# Patient Record
Sex: Male | Born: 1985 | Race: White | Hispanic: No | Marital: Single | Smoking: Current every day smoker
Health system: Southern US, Community
[De-identification: ages and names within clinical notes are randomized; demographics above are authoritative.]

## PROBLEM LIST (undated history)

## (undated) DIAGNOSIS — K589 Irritable bowel syndrome without diarrhea: Secondary | ICD-10-CM

---
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
## 2009-02-02 ENCOUNTER — Emergency Department (HOSPITAL_COMMUNITY): Admission: EM | Admit: 2009-02-02 | Discharge: 2009-02-02 | Payer: Self-pay | Admitting: Emergency Medicine

## 2010-02-27 ENCOUNTER — Emergency Department (HOSPITAL_COMMUNITY): Admission: EM | Admit: 2010-02-27 | Discharge: 2010-02-28 | Payer: Self-pay | Admitting: Emergency Medicine

## 2010-05-03 ENCOUNTER — Emergency Department (HOSPITAL_COMMUNITY)
Admission: EM | Admit: 2010-05-03 | Discharge: 2010-05-03 | Payer: Self-pay | Source: Home / Self Care | Admitting: Emergency Medicine

## 2010-06-15 LAB — DIFFERENTIAL
Basophils Absolute: 0.1 10*3/uL (ref 0.0–0.1)
Basophils Relative: 1 % (ref 0–1)
Eosinophils Absolute: 0.6 K/uL (ref 0.0–0.7)
Eosinophils Relative: 5 % (ref 0–5)
Lymphocytes Relative: 14 % (ref 12–46)
Lymphs Abs: 1.8 10*3/uL (ref 0.7–4.0)
Monocytes Absolute: 1.5 K/uL — ABNORMAL HIGH (ref 0.1–1.0)
Monocytes Relative: 12 % (ref 3–12)
Neutro Abs: 9 K/uL — ABNORMAL HIGH (ref 1.7–7.7)
Neutrophils Relative %: 70 % (ref 43–77)

## 2010-06-15 LAB — BASIC METABOLIC PANEL WITH GFR
BUN: 6 mg/dL (ref 6–23)
CO2: 25 meq/L (ref 19–32)
Chloride: 106 meq/L (ref 96–112)
Glucose, Bld: 105 mg/dL — ABNORMAL HIGH (ref 70–99)
Potassium: 3.3 meq/L — ABNORMAL LOW (ref 3.5–5.1)
Sodium: 139 meq/L (ref 135–145)

## 2010-06-15 LAB — CBC
HCT: 45.6 % (ref 39.0–52.0)
Hemoglobin: 15.9 g/dL (ref 13.0–17.0)
MCH: 32 pg (ref 26.0–34.0)
MCHC: 34.9 g/dL (ref 30.0–36.0)
MCV: 91.8 fL (ref 78.0–100.0)
Platelets: 213 10*3/uL (ref 150–400)
RBC: 4.97 MIL/uL (ref 4.22–5.81)
RDW: 14.3 % (ref 11.5–15.5)
WBC: 12.9 10*3/uL — ABNORMAL HIGH (ref 4.0–10.5)

## 2010-06-15 LAB — URINE MICROSCOPIC-ADD ON

## 2010-06-15 LAB — URINALYSIS, ROUTINE W REFLEX MICROSCOPIC
Bilirubin Urine: NEGATIVE
Glucose, UA: NEGATIVE mg/dL
Hgb urine dipstick: NEGATIVE
Ketones, ur: 15 mg/dL — AB
Leukocytes, UA: NEGATIVE
Nitrite: NEGATIVE
Protein, ur: 30 mg/dL — AB
Specific Gravity, Urine: 1.024 (ref 1.005–1.030)
Urobilinogen, UA: 0.2 mg/dL (ref 0.0–1.0)
pH: 5.5 (ref 5.0–8.0)

## 2010-06-15 LAB — BASIC METABOLIC PANEL
Calcium: 8.6 mg/dL (ref 8.4–10.5)
Creatinine, Ser: 0.7 mg/dL (ref 0.4–1.5)
GFR calc Af Amer: >60 mL/min (ref >60)
GFR calc non Af Amer: >60 mL/min (ref >60)

## 2011-06-03 ENCOUNTER — Encounter (HOSPITAL_COMMUNITY): Payer: Self-pay | Admitting: *Deleted

## 2011-06-03 ENCOUNTER — Emergency Department (HOSPITAL_COMMUNITY)
Admission: EM | Admit: 2011-06-03 | Discharge: 2011-06-03 | Disposition: A | Discharge status: Home / Self Care | Payer: Self-pay | Attending: Emergency Medicine | Admitting: Emergency Medicine

## 2011-06-03 DIAGNOSIS — K0889 Other specified disorders of teeth and supporting structures: Secondary | ICD-10-CM

## 2011-06-03 DIAGNOSIS — K089 Disorder of teeth and supporting structures, unspecified: Secondary | ICD-10-CM | POA: Insufficient documentation

## 2011-06-03 DIAGNOSIS — F17200 Nicotine dependence, unspecified, uncomplicated: Secondary | ICD-10-CM | POA: Insufficient documentation

## 2011-06-03 HISTORY — DX: Irritable bowel syndrome, unspecified: K58.9

## 2011-06-03 MED ORDER — HYDROCODONE-ACETAMINOPHEN 5-325 MG PO TABS
ORAL_TABLET | ORAL | Status: AC
  Administered 2011-06-03: 1 via ORAL
  Filled 2011-06-03: qty 1

## 2011-06-03 MED ORDER — PENICILLIN V POTASSIUM 500 MG PO TABS: 500.0000 mg | ORAL_TABLET | Freq: Four times a day (QID) | ORAL | Status: AC

## 2011-06-03 MED ORDER — HYDROCODONE-ACETAMINOPHEN 7.5-500 MG/15ML PO SOLN: 10.0000 mL | Freq: Once | ORAL | Status: DC

## 2011-06-03 MED ORDER — HYDROCODONE-ACETAMINOPHEN 5-325 MG PO TABS: 1.0000 | ORAL_TABLET | Freq: Four times a day (QID) | ORAL | Status: AC | PRN

## 2011-06-03 MED ORDER — HYDROCODONE-ACETAMINOPHEN 5-325 MG PO TABS
1.0000 | ORAL_TABLET | Freq: Once | ORAL | Status: AC
  Administered 2011-06-03: 1 via ORAL

## 2011-06-03 MED ORDER — PENICILLIN V POTASSIUM 250 MG PO TABS
500.0000 mg | ORAL_TABLET | Freq: Once | ORAL | Status: AC
  Administered 2011-06-03: 500 mg via ORAL
  Filled 2011-06-03: qty 2

## 2011-06-03 MED ORDER — IBUPROFEN 400 MG PO TABS
400.0000 mg | ORAL_TABLET | Freq: Once | ORAL | Status: AC
  Administered 2011-06-03: 400 mg via ORAL
  Filled 2011-06-03: qty 1

## 2011-06-03 NOTE — ED Notes (Signed)
Dental pain to upper left tooth began last night.

## 2011-06-03 NOTE — ED Notes (Signed)
Pt medicated per orders. Awaiting D/C instructions.

## 2011-06-03 NOTE — ED Provider Notes (Signed)
History     CSN: [id number]  Arrival date & time 06/03/11  1212   None     Chief Complaint  Patient presents with  . Dental Pain    (Consider location/radiation/quality/duration/timing/severity/associated sxs/prior treatment) HPI Comments: R upper molar area pain since last PM.  Patient is a 26 y.o. male presenting with tooth pain. The history is provided by the patient. No language interpreter was used.  Dental PainThe primary symptoms include mouth pain. Primary symptoms do not include dental injury. The symptoms began yesterday. The symptoms are unchanged. The symptoms occur constantly.  Additional symptoms do not include: facial swelling and ear pain.    Past Medical History  Diagnosis Date  . IBS (irritable bowel syndrome)     History reviewed. No pertinent past surgical history.  No family history on file.  History  Substance Use Topics  . Smoking status: Current Everyday Smoker  . Smokeless tobacco: Not on file  . Alcohol Use: Yes     Occ      Review of Systems  HENT: Positive for dental problem. Negative for ear pain and facial swelling.   All other systems reviewed and are negative.    Allergies  Review of patient's allergies indicates no known allergies.  Home Medications   Current Outpatient Rx  Name Route Sig Dispense Refill  . IBUPROFEN 200 MG PO TABS Oral Take 400 mg by mouth every 6 (six) hours as needed. For pain    . HYDROCODONE-ACETAMINOPHEN 5-325 MG PO TABS Oral Take 1 tablet by mouth every 6 (six) hours as needed for pain. 20 tablet 0  . PENICILLIN V POTASSIUM 500 MG PO TABS Oral Take 1 tablet (500 mg total) by mouth 4 (four) times daily. 40 tablet 0    BP 143/73  Pulse 102  Temp(Src) 98 F (36.7 C) (Oral)  Resp 20  Ht 5\' 5"  (1.651 m)  Wt 175 lb (79.379 kg)  BMI 29.12 kg/m2  SpO2 100%  Physical Exam  Nursing note and vitals reviewed. Constitutional: He is oriented to person, place, and time. He appears well-developed and  well-nourished.  HENT:  Head: Normocephalic and atraumatic.  Mouth/Throat: Uvula is midline, oropharynx is clear and moist and mucous membranes are normal. No uvula swelling.    Eyes: EOM are normal.  Neck: Normal range of motion.  Cardiovascular: Normal rate, regular rhythm, normal heart sounds and intact distal pulses.   Pulmonary/Chest: Effort normal and breath sounds normal. No respiratory distress.  Abdominal: Soft. He exhibits no distension. There is no tenderness.  Musculoskeletal: Normal range of motion.  Neurological: He is alert and oriented to person, place, and time.  Skin: Skin is warm and dry.  Psychiatric: He has a normal mood and affect. Judgment normal.    ED Course  Procedures (including critical care time)  Labs Reviewed - No data to display No results found.   1. Pain, dental       MDM  Hydrocodone, penicillin and ibuprofen.  F/u with your DDS ASAP        Worthy Rancher, PA 06/03/11 1424  Worthy Rancher, PA 06/09/11 1435

## 2011-06-03 NOTE — ED Notes (Signed)
Pt presents with left upper dental pain that started last night. NAD at this time.

## 2011-06-03 NOTE — ED Notes (Signed)
Pt a/ox4. resp even and unlabored. NAD at this time. D/C instructions reviewed with pt. Pt verbalized understanding. Pt ambulated to lobby with steady gate.  

## 2011-06-03 NOTE — Discharge Instructions (Signed)
Dental Pain Toothache is pain in or around a tooth. It may get worse with chewing or with cold or heat.  HOME CARE  Your dentist may use a numbing medicine during treatment. If so, you may need to avoid eating until the medicine wears off. Ask your dentist about this.   Only take medicine as told by your dentist or doctor.   Avoid chewing food near the painful tooth until after all treatment is done. Ask your dentist about this.  GET HELP RIGHT AWAY IF:   The problem gets worse or new problems appear.   You have a fever.   There is redness and puffiness (swelling) of the face, jaw, or neck.   You cannot open your mouth.   There is pain in the jaw.   There is very bad pain that is not helped by medicine.  MAKE SURE YOU:   Understand these instructions.   Will watch your condition.   Will get help right away if you are not doing well or get worse.  Document Released: 09/06/2007 Document Revised: 11/30/2010 Document Reviewed: 09/06/2007 [facility] Patient Information 2012 [place], [place].   Take the pain med and antibiotic as directed.  Take ibuprofen up to 800 mg every 8 hrs with food.  Follow up with your dentist ASAP.

## 2011-06-10 NOTE — ED Provider Notes (Signed)
Medical screening examination/treatment/procedure(s) were performed by non-physician practitioner and as supervising physician I was immediately available for consultation/collaboration.  Audrey Thull S. Shaye Lagace, MD 06/10/11 1815 

## 2012-01-11 ENCOUNTER — Encounter (HOSPITAL_BASED_OUTPATIENT_CLINIC_OR_DEPARTMENT_OTHER): Payer: Self-pay | Admitting: *Deleted

## 2012-01-11 ENCOUNTER — Emergency Department (HOSPITAL_BASED_OUTPATIENT_CLINIC_OR_DEPARTMENT_OTHER)
Admission: EM | Admit: 2012-01-11 | Discharge: 2012-01-12 | Disposition: A | Discharge status: Home / Self Care | Payer: Self-pay | Attending: Emergency Medicine | Admitting: Emergency Medicine

## 2012-01-11 ENCOUNTER — Emergency Department (HOSPITAL_BASED_OUTPATIENT_CLINIC_OR_DEPARTMENT_OTHER): Payer: Self-pay

## 2012-01-11 DIAGNOSIS — K589 Irritable bowel syndrome without diarrhea: Secondary | ICD-10-CM | POA: Insufficient documentation

## 2012-01-11 DIAGNOSIS — X58XXXA Exposure to other specified factors, initial encounter: Secondary | ICD-10-CM | POA: Insufficient documentation

## 2012-01-11 DIAGNOSIS — M25539 Pain in unspecified wrist: Secondary | ICD-10-CM | POA: Insufficient documentation

## 2012-01-11 DIAGNOSIS — S63509A Unspecified sprain of unspecified wrist, initial encounter: Secondary | ICD-10-CM | POA: Insufficient documentation

## 2012-01-11 DIAGNOSIS — F17200 Nicotine dependence, unspecified, uncomplicated: Secondary | ICD-10-CM | POA: Insufficient documentation

## 2012-01-11 MED ORDER — TRAMADOL HCL 50 MG PO TABS
50.0000 mg | ORAL_TABLET | Freq: Once | ORAL | Status: AC
  Administered 2012-01-11: 50 mg via ORAL
  Filled 2012-01-11: qty 1

## 2012-01-11 NOTE — ED Provider Notes (Signed)
History     CSN: [id number]  Arrival date & time 01/11/12  2259   First MD Initiated Contact with Patient 01/11/12 2315      Chief Complaint  Patient presents with  . Wrist Pain    (Consider location/radiation/quality/duration/timing/severity/associated sxs/prior treatment) Patient is a 26 y.o. male presenting with wrist pain. The history is provided by the patient. No language interpreter was used.  Wrist Pain This is a new problem. The current episode started 1 to 2 hours ago. The problem occurs constantly. The problem has not changed since onset.Pertinent negatives include no chest pain, no abdominal pain, no headaches and no shortness of breath. Nothing aggravates the symptoms. He has tried nothing for the symptoms. The treatment provided no relief.  Working with a 2x4 and thinks he heard a pop in lateral forearm.  No fall no additional trauma  Past Medical History  Diagnosis Date  . IBS (irritable bowel syndrome)     History reviewed. No pertinent past surgical history.  No family history on file.  History  Substance Use Topics  . Smoking status: Current Every Day Smoker  . Smokeless tobacco: Not on file  . Alcohol Use: Yes     Occ      Review of Systems  Respiratory: Negative for shortness of breath.   Cardiovascular: Negative for chest pain.  Gastrointestinal: Negative for abdominal pain.  Neurological: Negative for headaches.  All other systems reviewed and are negative.    Allergies  Review of patient's allergies indicates no known allergies.  Home Medications   Current Outpatient Rx  Name Route Sig Dispense Refill  . IBUPROFEN 200 MG PO TABS Oral Take 800 mg by mouth every 6 (six) hours as needed. For pain      BP 124/69  Pulse 80  Temp 98.5 F (36.9 C) (Oral)  Resp 18  Ht 5\' 6"  (1.676 m)  Wt 165 lb (74.844 kg)  BMI 26.63 kg/m2  SpO2 100%  Physical Exam  Constitutional: He is oriented to person, place, and time. He appears well-developed  and well-nourished.  HENT:  Head: Normocephalic and atraumatic.  Mouth/Throat: Oropharynx is clear and moist.  Eyes: Conjunctivae normal are normal. Pupils are equal, round, and reactive to light.  Neck: Normal range of motion. Neck supple.  Cardiovascular: Normal rate and regular rhythm.   Pulmonary/Chest: Effort normal and breath sounds normal. He has no wheezes. He has no rales.  Abdominal: Soft. Bowel sounds are normal. There is no tenderness. There is no rebound and no guarding.  Musculoskeletal: Normal range of motion.       No snuff box tenderness.  FROM of the right wrist right hand neurovascularly intact cap refill < 2 sec to all fingers  Neurological: He is alert and oriented to person, place, and time.  Skin: Skin is warm and dry.  Psychiatric: He has a normal mood and affect.    ED Course  Procedures (including critical care time)  Labs Reviewed - No data to display No results found.   No diagnosis found.    MDM  Sprain of right wrist, ice elevate and will place in splint       Blessin Kanno K Ronrico Dupin-Rasch, MD 01/12/12 [phone]

## 2012-01-11 NOTE — ED Notes (Signed)
Pt. States he was picking up wood today and felt a "pop" in his right wrist. States pain with movement. No deformity or swelling noted.

## 2012-01-12 MED ORDER — TRAMADOL HCL 50 MG PO TABS: 50.0000 mg | ORAL_TABLET | Freq: Four times a day (QID) | ORAL | Status: DC | PRN

## 2012-02-21 ENCOUNTER — Encounter (HOSPITAL_COMMUNITY): Payer: Self-pay | Admitting: *Deleted

## 2012-02-21 ENCOUNTER — Emergency Department (HOSPITAL_COMMUNITY)
Admission: EM | Admit: 2012-02-21 | Discharge: 2012-02-21 | Disposition: A | Discharge status: Home / Self Care | Payer: Self-pay | Attending: Emergency Medicine | Admitting: Emergency Medicine

## 2012-02-21 DIAGNOSIS — F17200 Nicotine dependence, unspecified, uncomplicated: Secondary | ICD-10-CM | POA: Insufficient documentation

## 2012-02-21 DIAGNOSIS — K089 Disorder of teeth and supporting structures, unspecified: Secondary | ICD-10-CM | POA: Insufficient documentation

## 2012-02-21 DIAGNOSIS — K0889 Other specified disorders of teeth and supporting structures: Secondary | ICD-10-CM

## 2012-02-21 DIAGNOSIS — K589 Irritable bowel syndrome without diarrhea: Secondary | ICD-10-CM | POA: Insufficient documentation

## 2012-02-21 MED ORDER — DICLOFENAC SODIUM 75 MG PO TBEC: 75.0000 mg | DELAYED_RELEASE_TABLET | Freq: Two times a day (BID) | ORAL | Status: DC

## 2012-02-21 MED ORDER — HYDROCODONE-ACETAMINOPHEN 5-325 MG PO TABS: 1.0000 | ORAL_TABLET | ORAL | Status: DC | PRN

## 2012-02-21 MED ORDER — AMOXICILLIN 500 MG PO CAPS: 500.0000 mg | ORAL_CAPSULE | Freq: Three times a day (TID) | ORAL | Status: DC

## 2012-02-21 NOTE — ED Provider Notes (Signed)
History     CSN: [id number]  Arrival date & time 02/21/12  2956   First MD Initiated Contact with Patient 02/21/12 [phone]      Chief Complaint  Patient presents with  . Dental Pain    (Consider location/radiation/quality/duration/timing/severity/associated sxs/prior treatment) Patient is a 26 y.o. male presenting with tooth pain. The history is provided by the patient.  Dental PainThe primary symptoms include mouth pain. Primary symptoms do not include shortness of breath or cough. The symptoms began yesterday. The symptoms are worsening. The symptoms occur constantly.  Additional symptoms include: gum swelling and jaw pain. Additional symptoms do not include: trouble swallowing and nosebleeds. Medical issues include: alcohol problem and smoking.    Past Medical History  Diagnosis Date  . IBS (irritable bowel syndrome)     History reviewed. No pertinent past surgical history.  No family history on file.  History  Substance Use Topics  . Smoking status: Current Every Day Smoker    Types: Cigarettes  . Smokeless tobacco: Not on file  . Alcohol Use: Yes     Comment: Occ      Review of Systems  Constitutional: Negative for activity change.       All ROS Neg except as noted in HPI  HENT: Negative for nosebleeds, trouble swallowing and neck pain.   Eyes: Negative for photophobia and discharge.  Respiratory: Negative for cough, shortness of breath and wheezing.   Cardiovascular: Negative for chest pain and palpitations.  Gastrointestinal: Negative for abdominal pain and blood in stool.  Genitourinary: Negative for dysuria, frequency and hematuria.  Musculoskeletal: Negative for back pain and arthralgias.  Skin: Negative.   Neurological: Negative for dizziness, seizures and speech difficulty.  Psychiatric/Behavioral: Negative for hallucinations and confusion.    Allergies  Coconut oil  Home Medications   Current Outpatient Rx  Name  Route  Sig  Dispense  Refill  .  IBUPROFEN 200 MG PO TABS   Oral   Take 800 mg by mouth every 6 (six) hours as needed. For pain           BP 127/63  Pulse 88  Temp 98.2 F (36.8 C) (Oral)  Resp 14  SpO2 100%  Physical Exam  Nursing note and vitals reviewed. Constitutional: He is oriented to person, place, and time. He appears well-developed and well-nourished.  Non-toxic appearance.  HENT:  Head: Normocephalic.  Right Ear: Tympanic membrane and external ear normal.  Left Ear: Tympanic membrane and external ear normal.       Pain to palpation of the right upper premolar and 1st molar.  No visible cavity or abscess.  Mod swelling around the tooth. Airway patent. No swelling under the tongue.  Eyes: EOM and lids are normal. Pupils are equal, round, and reactive to light.  Neck: Normal range of motion. Neck supple. Carotid bruit is not present.  Cardiovascular: Normal rate, regular rhythm, normal heart sounds, intact distal pulses and normal pulses.   Pulmonary/Chest: Breath sounds normal. No respiratory distress.  Abdominal: Soft. Bowel sounds are normal. There is no tenderness. There is no guarding.  Musculoskeletal: Normal range of motion.  Lymphadenopathy:       Head (right side): No submandibular adenopathy present.       Head (left side): No submandibular adenopathy present.    He has no cervical adenopathy.  Neurological: He is alert and oriented to person, place, and time. He has normal strength. No cranial nerve deficit or sensory deficit.  Skin: Skin is warm  and dry.  Psychiatric: He has a normal mood and affect. His speech is normal.    ED Course  Procedures (including critical care time)  Labs Reviewed - No data to display No results found. PUlse ox 100% on room air. WNL by my interpretation.  No diagnosis found.    MDM  I have reviewed nursing notes, vital signs, and all appropriate lab and imaging results for this patient. Pt has swelling around the upper premolar on the right. Rx for  amoxil, diclofenac and norco given to the patient. Pt has a dental appointment in 10 days.       Kathie Dike, [place] 02/21/12 [phone]

## 2012-02-21 NOTE — ED Provider Notes (Signed)
Medical screening examination/treatment/procedure(s) were performed by non-physician practitioner and as supervising physician I was immediately available for consultation/collaboration.  Donnetta Hutching, MD 02/21/12 1245

## 2012-02-21 NOTE — ED Notes (Signed)
Right upper dental pain since last night. Occurred while eating a biscuit

## 2013-01-03 ENCOUNTER — Encounter (HOSPITAL_COMMUNITY): Payer: Self-pay | Admitting: Emergency Medicine

## 2013-01-03 ENCOUNTER — Emergency Department (HOSPITAL_COMMUNITY)
Admission: EM | Admit: 2013-01-03 | Discharge: 2013-01-03 | Disposition: A | Discharge status: Home / Self Care | Payer: Worker's Compensation | Attending: Emergency Medicine | Admitting: Emergency Medicine

## 2013-01-03 DIAGNOSIS — Z8719 Personal history of other diseases of the digestive system: Secondary | ICD-10-CM | POA: Insufficient documentation

## 2013-01-03 DIAGNOSIS — Z77098 Contact with and (suspected) exposure to other hazardous, chiefly nonmedicinal, chemicals: Secondary | ICD-10-CM | POA: Insufficient documentation

## 2013-01-03 DIAGNOSIS — F17200 Nicotine dependence, unspecified, uncomplicated: Secondary | ICD-10-CM | POA: Insufficient documentation

## 2013-01-03 DIAGNOSIS — Z79899 Other long term (current) drug therapy: Secondary | ICD-10-CM | POA: Insufficient documentation

## 2013-01-03 DIAGNOSIS — Z042 Encounter for examination and observation following work accident: Secondary | ICD-10-CM | POA: Insufficient documentation

## 2013-01-03 NOTE — ED Notes (Signed)
Pt reports today due to being exposed to "oil and water" also known as "Mobilcut 102." Pt reports working with these chemicals while at work and was using cloth gloves, which absorbed the chemicals. Pt reports having cuts on his hands prior to the incident. Pt reports that after he noticed the wet gloves he replaced them with rubber gloves. Pt reported feeling generalized aches yesterday, which subsided today. Pt also reports developing finger numbness, which has persisted and is concerned regarding this. Pt was sent here by work to have a non-DOT drug test and a return to work evaluation. Pt is A/O x4 and in NAD.

## 2013-01-03 NOTE — ED Provider Notes (Signed)
CSN: [id number]     Arrival date & time 01/03/13  2155 History   First MD Initiated Contact with Patient 01/03/13 2211     Chief Complaint  Patient presents with  . Numbness    Finger   (Consider location/radiation/quality/duration/timing/severity/associated sxs/prior Treatment) HPI Patient presents emergency department with a workman's comp-related issue.  The patient, states, that he was working with a new, chemical several days, ago and was wearing gloves were made of cotton, and they were soaked in the material, and he had several small cuts on his right second and third fingers.  Patient, states, that he had some tingling in his fingertips on those fingers since that time.  Patient denies shortness of breath, nausea, vomiting, diarrhea, blurred vision, weakness, numbness, dizziness, or syncope Past Medical History  Diagnosis Date  . IBS (irritable bowel syndrome)    History reviewed. No pertinent past surgical history. No family history on file. History  Substance Use Topics  . Smoking status: Current Every Day Smoker    Types: Cigarettes  . Smokeless tobacco: Not on file  . Alcohol Use: Yes     Comment: Occ    Review of Systems All other systems negative except as documented in the HPI. All pertinent positives and negatives as reviewed in the HPI. Allergies  Coconut oil  Home Medications   Current Outpatient Rx  Name  Route  Sig  Dispense  Refill  . amphetamine-dextroamphetamine (ADDERALL) 15 MG tablet   Oral   Take 15 mg by mouth 3 (three) times daily.          . busPIRone (BUSPAR) 15 MG tablet   Oral   Take 7.5 mg by mouth at bedtime.           BP 144/69  Pulse 113  Temp(Src) 99.5 F (37.5 C) (Oral)  Resp 18  SpO2 100% Physical Exam  Nursing note and vitals reviewed. Constitutional: He is oriented to person, place, and time. He appears well-developed and well-nourished. No distress.  HENT:  Head: Normocephalic and atraumatic.  Mouth/Throat:  Oropharynx is clear and moist.  Eyes: Pupils are equal, round, and reactive to light.  Cardiovascular: Normal rate and regular rhythm.   Pulmonary/Chest: Effort normal and breath sounds normal.  Musculoskeletal:  Patient does not have any motor deficits and his fingers the right hand.  Patient does have some tingling noted in his fingers grossly on exam.  Sharp and dull sensation are intact and 2 point discrimination  Neurological: He is alert and oriented to person, place, and time.  Skin: Skin is warm and dry.    ED Course  Procedures (including critical care time)  Patient is advised that this most likely will resolve on its own.  He is advised to return here as needed MDM      Carlyle Dolly, PA-C 01/03/13 2258

## 2013-01-04 NOTE — ED Provider Notes (Signed)
Medical screening examination/treatment/procedure(s) were performed by non-physician practitioner and as supervising physician I was immediately available for consultation/collaboration.  Dallan Schonberg M Hayleen Clinkscales, MD 01/04/13 1324 

## 2013-03-23 IMAGING — CR DG WRIST COMPLETE 3+V*R*
4 series · 4 of 4 positions shown · non-contrast
Comparison: Right hand series 05/03/2010.

CLINICAL DATA: 26-year-old male with pain after blunt trauma.

RIGHT WRIST - COMPLETE 3+ VIEW

[x wrist pa right]
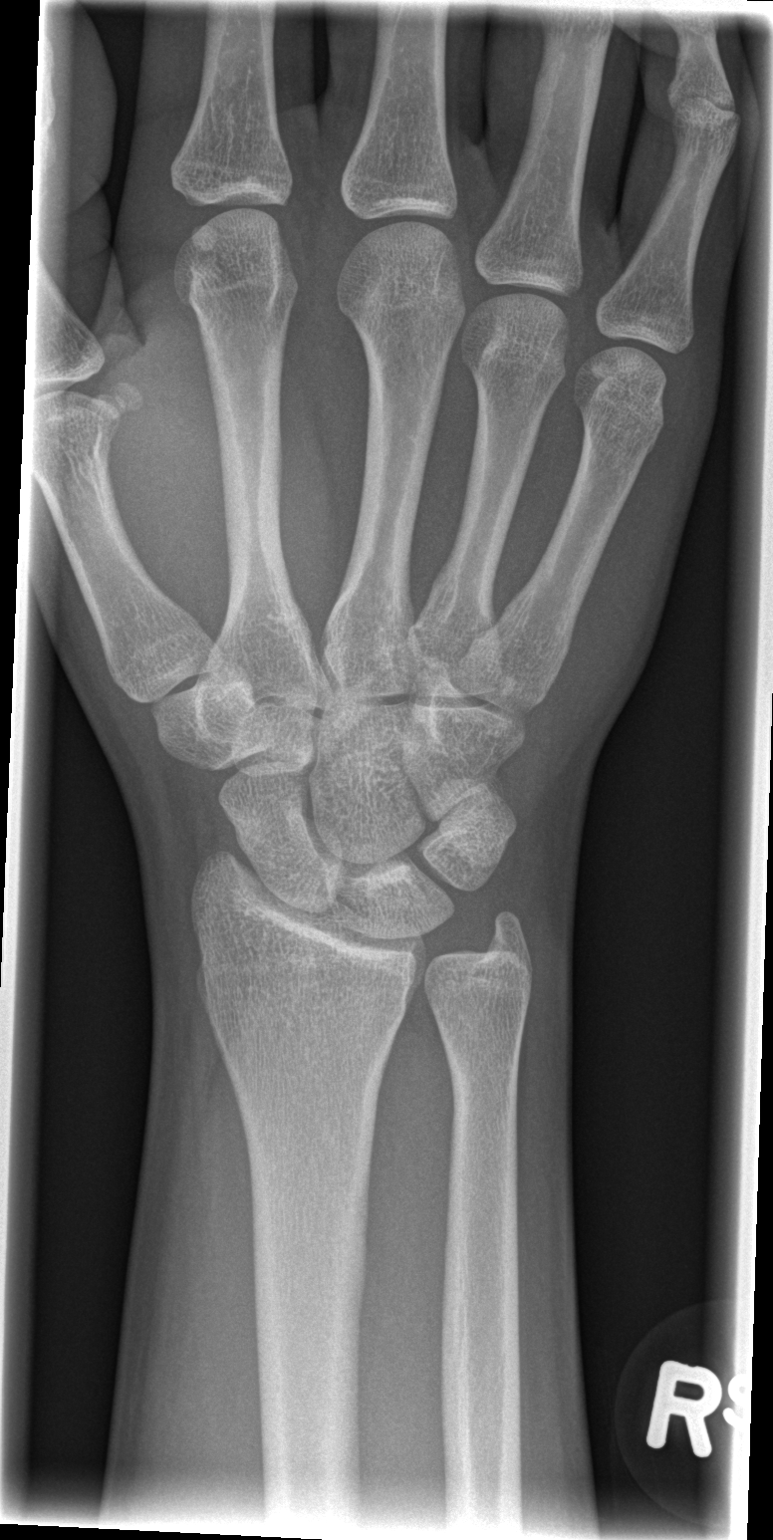

[x wrist obl right]
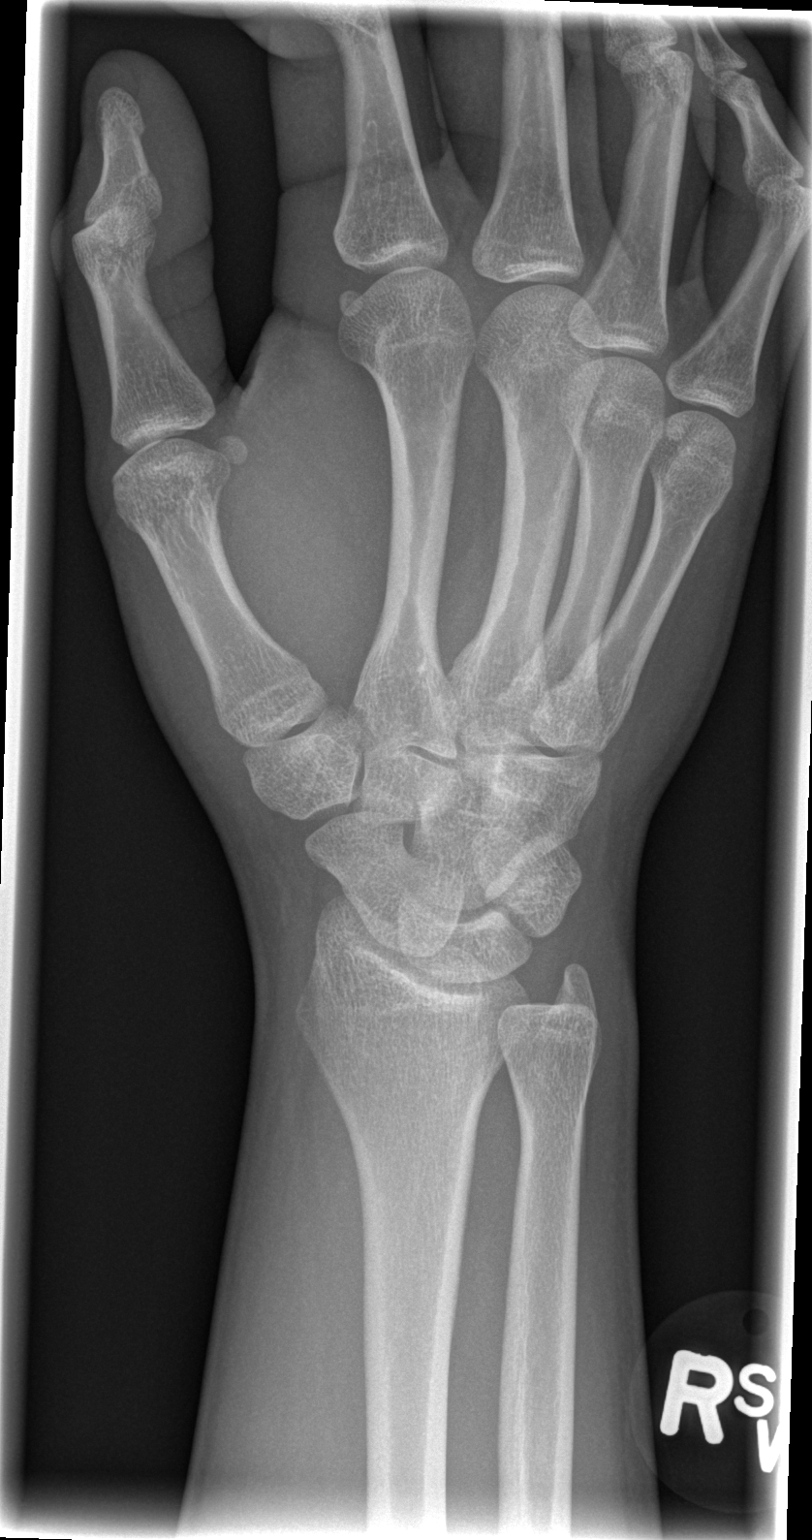

[x wrist lat right]
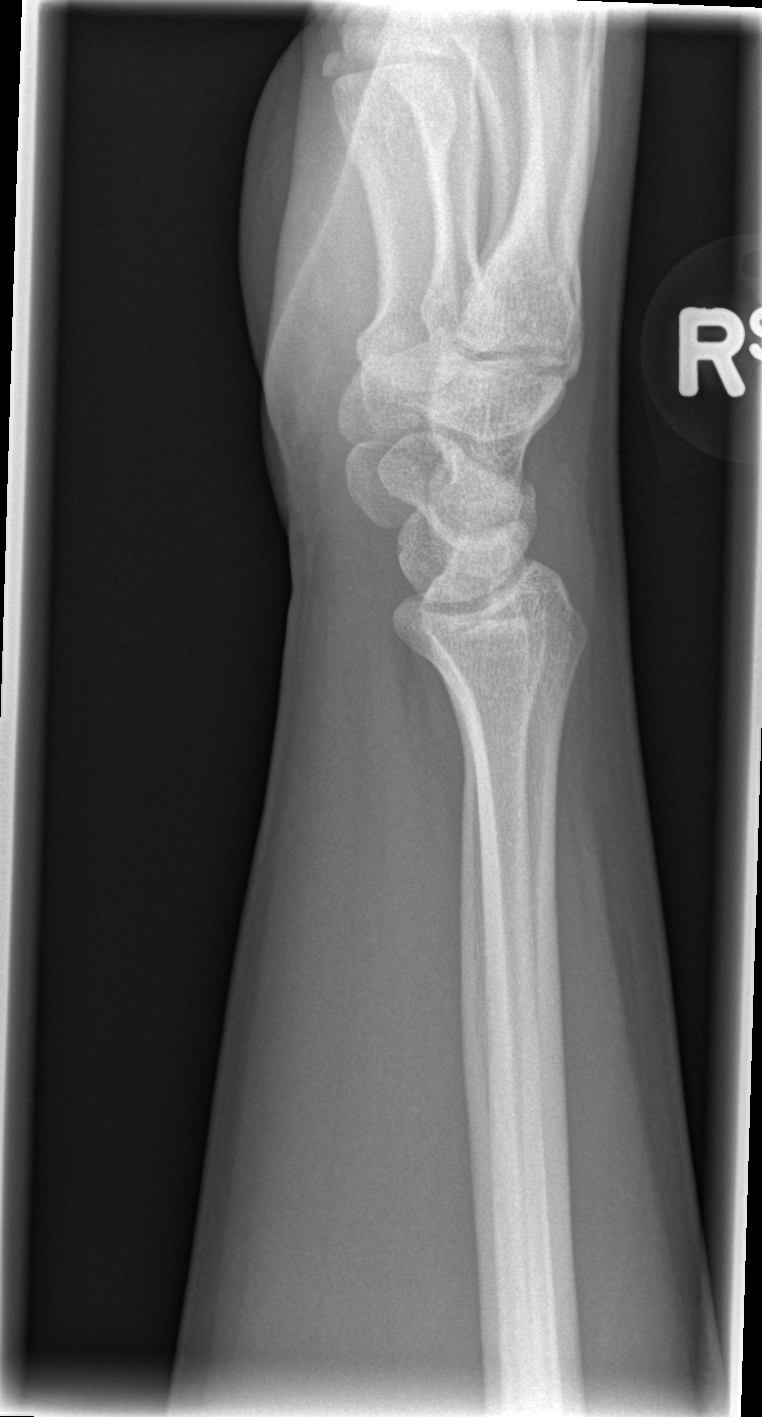

[x navicular]
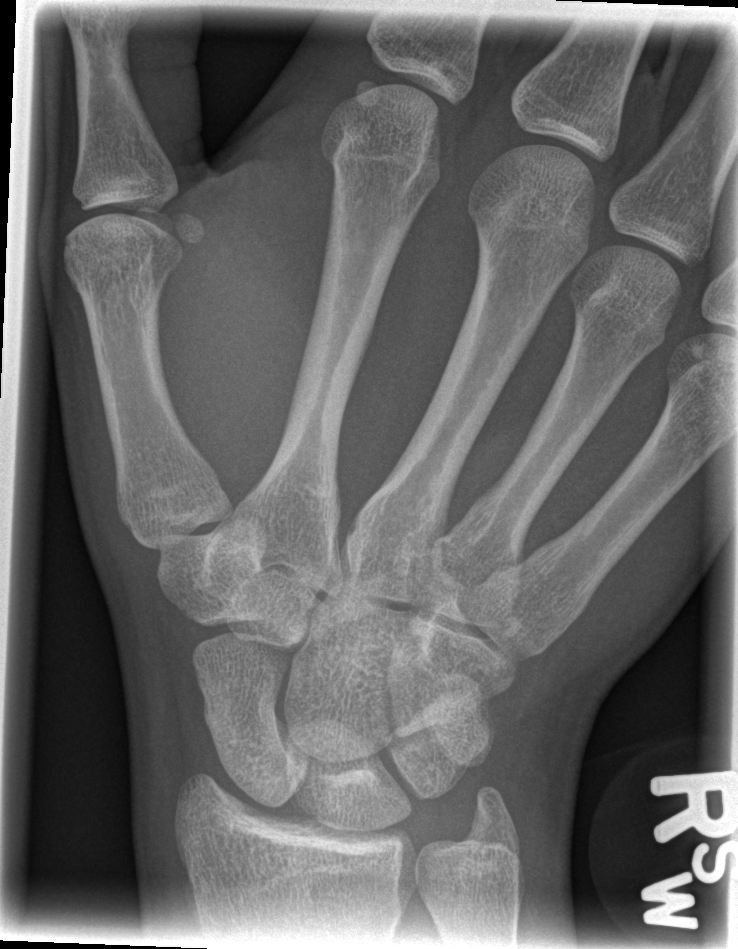

[4 of 4 positions shown; findings below may reference images not displayed]

FINDINGS: Bone mineralization is within normal limits.  Distal
radius and ulna intact.  Carpal bone alignment within normal
limits.  Scaphoid intact.  Metacarpals appear intact.  Joint spaces
preserved.
IMPRESSION: No acute fracture or dislocation identified about the right wrist.

## 2013-10-24 ENCOUNTER — Telehealth: Payer: Self-pay | Admitting: Family Medicine

## 2013-10-24 NOTE — Telephone Encounter (Signed)
Pt has pcp at another place.  Was told to f/u there unless he wants to  transf. Here.  He did not want to transf.

## 2014-05-01 ENCOUNTER — Ambulatory Visit: Payer: Self-pay | Admitting: Nurse Practitioner
